# Patient Record
Sex: Male | Born: 1990 | Race: White | Hispanic: No | Marital: Single | Smoking: Never smoker
Health system: Southern US, Community
[De-identification: ages and names within clinical notes are randomized; demographics above are authoritative.]

## PROBLEM LIST (undated history)

## (undated) DIAGNOSIS — M041 Periodic fever syndromes: Secondary | ICD-10-CM

---

## 2010-01-28 ENCOUNTER — Emergency Department (HOSPITAL_COMMUNITY): Admission: EM | Admit: 2010-01-28 | Discharge: 2010-01-28 | Payer: Self-pay | Admitting: Emergency Medicine

## 2010-04-06 ENCOUNTER — Emergency Department (HOSPITAL_COMMUNITY): Admission: EM | Admit: 2010-04-06 | Discharge: 2010-04-06 | Payer: Self-pay | Admitting: Emergency Medicine

## 2010-09-27 LAB — CBC
HCT: 36.7 % — ABNORMAL LOW (ref 39.0–52.0)
Hemoglobin: 13.1 g/dL (ref 13.0–17.0)
MCH: 30.8 pg (ref 26.0–34.0)
MCV: 86.4 fL (ref 78.0–100.0)
Platelets: 176 10*3/uL (ref 150–400)
RBC: 4.25 MIL/uL (ref 4.22–5.81)
WBC: 2.7 10*3/uL — ABNORMAL LOW (ref 4.0–10.5)

## 2010-09-27 LAB — COMPREHENSIVE METABOLIC PANEL
Albumin: 3.8 g/dL (ref 3.5–5.2)
Alkaline Phosphatase: 66 U/L (ref 39–117)
BUN: 9 mg/dL (ref 6–23)
CO2: 26 mEq/L (ref 19–32)
Chloride: 102 mEq/L (ref 96–112)
Creatinine, Ser: 0.83 mg/dL (ref 0.4–1.5)
GFR calc non Af Amer: >60 mL/min (ref >60)
Glucose, Bld: 90 mg/dL (ref 70–99)
Potassium: 3.6 mEq/L (ref 3.5–5.1)
Total Bilirubin: 0.6 mg/dL (ref 0.3–1.2)

## 2010-09-27 LAB — DIFFERENTIAL
Basophils Absolute: 0 10*3/uL (ref 0.0–0.1)
Eosinophils Absolute: 0 10*3/uL (ref 0.0–0.7)
Lymphocytes Relative: 46 % (ref 12–46)
Monocytes Relative: 19 % — ABNORMAL HIGH (ref 3–12)
Neutro Abs: 0.9 10*3/uL — ABNORMAL LOW (ref 1.7–7.7)
Neutrophils Relative %: 34 % — ABNORMAL LOW (ref 43–77)

## 2010-09-29 LAB — COMPREHENSIVE METABOLIC PANEL
Albumin: 4.1 g/dL (ref 3.5–5.2)
BUN: 12 mg/dL (ref 6–23)
Calcium: 8.8 mg/dL (ref 8.4–10.5)
Creatinine, Ser: 0.85 mg/dL (ref 0.4–1.5)
Potassium: 3.8 mEq/L (ref 3.5–5.1)
Total Protein: 7.5 g/dL (ref 6.0–8.3)

## 2010-09-29 LAB — URINALYSIS, ROUTINE W REFLEX MICROSCOPIC
Glucose, UA: NEGATIVE mg/dL
Hgb urine dipstick: NEGATIVE
Ketones, ur: 15 mg/dL — AB
Leukocytes, UA: NEGATIVE
Protein, ur: 30 mg/dL — AB
Urobilinogen, UA: 2 mg/dL — ABNORMAL HIGH (ref 0.0–1.0)

## 2010-09-29 LAB — CBC
MCH: 32.3 pg (ref 26.0–34.0)
MCHC: 35 g/dL (ref 30.0–36.0)
MCV: 92.3 fL (ref 78.0–100.0)
Platelets: 151 10*3/uL (ref 150–400)
RDW: 12.1 % (ref 11.5–15.5)

## 2010-09-29 LAB — URINE MICROSCOPIC-ADD ON

## 2010-09-29 LAB — DIFFERENTIAL
Lymphocytes Relative: 20 % (ref 12–46)
Lymphs Abs: 0.6 10*3/uL — ABNORMAL LOW (ref 0.7–4.0)
Monocytes Absolute: 0.5 10*3/uL (ref 0.1–1.0)
Monocytes Relative: 18 % — ABNORMAL HIGH (ref 3–12)
Neutro Abs: 1.7 10*3/uL (ref 1.7–7.7)

## 2012-02-03 ENCOUNTER — Emergency Department (HOSPITAL_COMMUNITY)
Admission: EM | Admit: 2012-02-03 | Discharge: 2012-02-03 | Disposition: A | Discharge status: Home / Self Care | Payer: Worker's Compensation | Attending: Emergency Medicine | Admitting: Emergency Medicine

## 2012-02-03 ENCOUNTER — Encounter (HOSPITAL_COMMUNITY): Payer: Self-pay | Admitting: Emergency Medicine

## 2012-02-03 DIAGNOSIS — S61209A Unspecified open wound of unspecified finger without damage to nail, initial encounter: Secondary | ICD-10-CM | POA: Insufficient documentation

## 2012-02-03 DIAGNOSIS — Z23 Encounter for immunization: Secondary | ICD-10-CM | POA: Insufficient documentation

## 2012-02-03 DIAGNOSIS — Y93G1 Activity, food preparation and clean up: Secondary | ICD-10-CM | POA: Insufficient documentation

## 2012-02-03 DIAGNOSIS — S61019A Laceration without foreign body of unspecified thumb without damage to nail, initial encounter: Secondary | ICD-10-CM

## 2012-02-03 DIAGNOSIS — W261XXA Contact with sword or dagger, initial encounter: Secondary | ICD-10-CM | POA: Insufficient documentation

## 2012-02-03 DIAGNOSIS — W260XXA Contact with knife, initial encounter: Secondary | ICD-10-CM | POA: Insufficient documentation

## 2012-02-03 HISTORY — DX: Periodic fever syndromes: M04.1

## 2012-02-03 MED ORDER — "THROMBI-PAD 3""X3"" EX PADS"
1.0000 | MEDICATED_PAD | Freq: Once | CUTANEOUS | Status: AC
  Administered 2012-02-03: 1 via TOPICAL
  Filled 2012-02-03: qty 1

## 2012-02-03 MED ORDER — OXYCODONE-ACETAMINOPHEN 5-325 MG PO TABS
1.0000 | ORAL_TABLET | Freq: Once | ORAL | Status: AC
  Administered 2012-02-03: 1 via ORAL
  Filled 2012-02-03: qty 1

## 2012-02-03 MED ORDER — TETANUS-DIPHTH-ACELL PERTUSSIS 5-2.5-18.5 LF-MCG/0.5 IM SUSP
0.5000 mL | Freq: Once | INTRAMUSCULAR | Status: AC
  Administered 2012-02-03: 0.5 mL via INTRAMUSCULAR
  Filled 2012-02-03: qty 0.5

## 2012-02-03 NOTE — ED Provider Notes (Signed)
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  Patient has an avulsion laceration on the end of his left from that is just into the subcutaneous fat tissue it is bleeding. That nail is intact. The avulsion is a half a centimeter.  Have discussed care with his PA.  Medical screening examination/treatment/procedure(s) were conducted as a shared visit with non-physician practitioner(s) and myself.  I personally evaluated the patient during the encounter  Devoria Albe, MD, Franz Dell, MD 02/03/12 1331

## 2012-02-03 NOTE — ED Provider Notes (Signed)
See prior note   Ward Givens, MD 02/03/12 [phone]

## 2012-02-03 NOTE — ED Notes (Signed)
Dressing reinforced. Bleeding controlled.

## 2012-02-03 NOTE — ED Notes (Signed)
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

## 2012-02-03 NOTE — ED Provider Notes (Signed)
History     CSN: [id number]  Arrival date & time 02/03/12  1146   First MD Initiated Contact with Patient 02/03/12 1258      Chief Complaint  Patient presents with  . Extremity Laceration    left thumb    (Consider location/radiation/quality/duration/timing/severity/associated sxs/prior treatment) HPI Comments: 21 y/o male here s/p cutting off the tip of his left thumb at work an hour and a half ago while cutting peppers. He was unable to obtain the tip of the thumb because it got lost in the mix of the peppers which were thrown out. Last tetanus in 2006. Able to move his thumb without difficulty.  The history is provided by the patient.    Past Medical History  Diagnosis Date  . Periodic fever syndrome     No past surgical history on file.  No family history on file.  History  Substance Use Topics  . Smoking status: Never Smoker   . Smokeless tobacco: Not on file  . Alcohol Use: Yes     occasional      Review of Systems  Respiratory: Negative for shortness of breath.   Cardiovascular: Negative for chest pain.  Gastrointestinal: Negative for nausea.  Skin: Positive for wound (left thumb laceration).  Neurological: Negative for dizziness, syncope and light-headedness.    Allergies  Review of patient's allergies indicates no known allergies.  Home Medications   Current Outpatient Rx  Name Route Sig Dispense Refill  . COLCHICINE 0.6 MG PO TABS Oral Take 0.6 mg by mouth daily.    Marland Kitchen NAPROXEN 500 MG PO TABS Oral Take 500 mg by mouth 2 (two) times daily with a meal.      BP 138/88  Pulse 82  Temp 98.3 F (36.8 C)  Resp 16  SpO2 99%  Physical Exam  Constitutional: He is oriented to person, place, and time. He appears well-developed and well-nourished. No distress.  HENT:  Head: Normocephalic and atraumatic.  Eyes: Conjunctivae are normal. Pupils are equal, round, and reactive to light.  Cardiovascular: Normal rate, regular rhythm, normal heart sounds and  intact distal pulses.   Pulmonary/Chest: Effort normal and breath sounds normal.  Neurological: He is alert and oriented to person, place, and time. He has normal strength. No sensory deficit.       No evidence of neurovascular compromise.  Skin: Skin is warm. Laceration (superficial aspect of left thumb cut off. bleeding not controlled) noted.  Psychiatric: He has a normal mood and affect. His behavior is normal.    ED Course  Procedures (including critical care time)  Labs Reviewed - No data to display No results found.   No diagnosis found.    MDM  21 y/o male with left thumb superficial laceration. Thrombi-pad applied. Tetanus booster given. Patient became a little light headed while changing the wound dressing and receiving tetanus shot. Gave some water and he felt fine. Patient stable for discharge.         Trevor Mace, PA-C 02/03/12 1359

## 2013-04-06 ENCOUNTER — Emergency Department (HOSPITAL_COMMUNITY)
Admission: EM | Admit: 2013-04-06 | Discharge: 2013-04-06 | Disposition: A | Discharge status: Home / Self Care | Payer: Federal, State, Local not specified - PPO | Attending: Emergency Medicine | Admitting: Emergency Medicine

## 2013-04-06 ENCOUNTER — Encounter (HOSPITAL_COMMUNITY): Payer: Self-pay | Admitting: *Deleted

## 2013-04-06 DIAGNOSIS — Z79899 Other long term (current) drug therapy: Secondary | ICD-10-CM | POA: Insufficient documentation

## 2013-04-06 DIAGNOSIS — B86 Scabies: Secondary | ICD-10-CM | POA: Insufficient documentation

## 2013-04-06 MED ORDER — PERMETHRIN 5 % EX CREA: TOPICAL_CREAM | CUTANEOUS | Status: AC

## 2013-04-06 NOTE — ED Notes (Signed)
Pt reports developing bumps on body starting 3 weeks ago. States that his roommate was diagnosed with scabies and he thinks he has scabies, too.

## 2013-04-06 NOTE — ED Notes (Signed)
Pt reports a rash to his upper extremities, chest, back, and groin. Pt states itching started in groin area. Symptoms started three weeks ago. Pt denies trying any treatments. Pt is A&Ox4, respirations equal and unlabored, skin warm and dry.

## 2013-04-06 NOTE — ED Provider Notes (Signed)
Medical screening examination/treatment/procedure(s) were performed by non-physician practitioner and as supervising physician I was immediately available for consultation/collaboration.  Veora Fonte, MD 04/06/13 0607 

## 2013-04-06 NOTE — ED Provider Notes (Addendum)
CSN: [id number]     Arrival date & time 04/06/13  0014 History   First MD Initiated Contact with Patient 04/06/13 [phone]     Chief Complaint  Patient presents with  . Rash   (Consider location/radiation/quality/duration/timing/severity/associated sxs/prior Treatment) HPI Comments: Patient remained has known scabies.  He started having some itchy rash in the normal target areas, including groin, armpits moist and several weeks, ago.  This has progressed to generalized body lesions  Patient is a 22 y.o. male presenting with rash. The history is provided by the patient.  Rash Location:  Full body Quality: itchiness and redness   Severity:  Moderate Onset quality:  Gradual Duration: Weeks. Timing:  Constant Progression:  Worsening Chronicity:  New Context: exposure to similar rash   Relieved by:  None tried Worsened by:  Nothing tried Ineffective treatments:  None tried Associated symptoms: no fever     Past Medical History  Diagnosis Date  . Periodic fever syndrome    History reviewed. No pertinent past surgical history. History reviewed. No pertinent family history. History  Substance Use Topics  . Smoking status: Never Smoker   . Smokeless tobacco: Not on file  . Alcohol Use: Yes     Comment: occasional    Review of Systems  Constitutional: Negative for fever.  Genitourinary: Negative for scrotal swelling and testicular pain.  Skin: Positive for rash.  All other systems reviewed and are negative.    Allergies  Review of patient's allergies indicates no known allergies.  Home Medications   Current Outpatient Rx  Name  Route  Sig  Dispense  Refill  . colchicine 0.6 MG tablet   Oral   Take 0.6 mg by mouth daily.         . naproxen (NAPROSYN) 500 MG tablet   Oral   Take 500 mg by mouth 2 (two) times daily with a meal.         . permethrin (ELIMITE) 5 % cream      Apply to affected area once it on for 8-12 hours.  Wash off repeat this in 2 weeks.   60  g   1    BP 159/97  Pulse 109  Temp(Src) 98.6 F (37 C) (Oral)  Resp 16  SpO2 95% Physical Exam  Constitutional: He appears well-developed.  HENT:  Head: Normocephalic.  Eyes: Pupils are equal, round, and reactive to light.  Neck: Normal range of motion.  Cardiovascular: Normal rate.   Skin: Rash noted.    ED Course  Procedures (including critical care time) Labs Review Labs Reviewed - No data to display Imaging Review No results found.  MDM   1. Scabies     I have instructed the patient.  In the proper use of the permethrin cream to be repeated in the 14 days.  Have also recommended that he spray for his furniture car seats, a posterior items and wash all his clothing, bed linen, etc. in hot soapy water    Arman Filter, NP 04/06/13 0304  Arman Filter, NP 04/06/13 0304  Arman Filter, NP 04/06/13 0304  Arman Filter, NP 04/17/13 2000

## 2013-04-17 NOTE — ED Provider Notes (Signed)
Medical screening examination/treatment/procedure(s) were performed by non-physician practitioner and as supervising physician I was immediately available for consultation/collaboration.  Sunnie Nielsen, MD 04/17/13 [phone]

## 2015-01-11 ENCOUNTER — Emergency Department (HOSPITAL_COMMUNITY)
Admission: EM | Admit: 2015-01-11 | Discharge: 2015-01-11 | Disposition: A | Discharge status: Home / Self Care | Payer: Federal, State, Local not specified - PPO | Attending: Emergency Medicine | Admitting: Emergency Medicine

## 2015-01-11 ENCOUNTER — Encounter (HOSPITAL_COMMUNITY): Payer: Self-pay | Admitting: *Deleted

## 2015-01-11 DIAGNOSIS — Z791 Long term (current) use of non-steroidal anti-inflammatories (NSAID): Secondary | ICD-10-CM | POA: Insufficient documentation

## 2015-01-11 DIAGNOSIS — M791 Myalgia: Secondary | ICD-10-CM | POA: Insufficient documentation

## 2015-01-11 DIAGNOSIS — L02411 Cutaneous abscess of right axilla: Secondary | ICD-10-CM | POA: Insufficient documentation

## 2015-01-11 DIAGNOSIS — Z79899 Other long term (current) drug therapy: Secondary | ICD-10-CM | POA: Diagnosis not present

## 2015-01-11 DIAGNOSIS — L089 Local infection of the skin and subcutaneous tissue, unspecified: Secondary | ICD-10-CM | POA: Diagnosis present

## 2015-01-11 DIAGNOSIS — L0291 Cutaneous abscess, unspecified: Secondary | ICD-10-CM

## 2015-01-11 MED ORDER — SULFAMETHOXAZOLE-TRIMETHOPRIM 800-160 MG PO TABS
1.0000 | ORAL_TABLET | Freq: Once | ORAL | Status: AC
  Administered 2015-01-11: 1 via ORAL
  Filled 2015-01-11: qty 1

## 2015-01-11 MED ORDER — HYDROCODONE-ACETAMINOPHEN 5-325 MG PO TABS: 1.0000 | ORAL_TABLET | Freq: Four times a day (QID) | ORAL | Status: DC | PRN

## 2015-01-11 MED ORDER — SULFAMETHOXAZOLE-TRIMETHOPRIM 800-160 MG PO TABS: 1.0000 | ORAL_TABLET | Freq: Two times a day (BID) | ORAL | Status: AC

## 2015-01-11 NOTE — ED Notes (Signed)
Questions concerns denied r/t dc. Pt ambulatory and a&ox4 

## 2015-01-11 NOTE — ED Notes (Signed)
Patient came into ED today d/t boil/ pain 5/10 under right underarm. Pain started around 3 am today. He denies injury to area.

## 2015-01-11 NOTE — Discharge Instructions (Signed)

## 2015-01-11 NOTE — ED Provider Notes (Signed)
CSN: [id number]     Arrival date & time 01/11/15  1201 History  This chart was scribed for non-physician practitioner, Dahlia ClientBrowning, PA-C working with Purvis SheffieldForrest Harrison, MD by Placido SouLogan Joldersma, ED scribe. This patient was seen in room WTR6/WTR6 and the patient's care was started at 12:09 PM.    Chief Complaint  Patient presents with  . Recurrent Skin Infections    boil under right arm   The history is provided by the patient. No language interpreter was used.    HPI Comments: Noah Charles is a 24 y.o. male who presents to the Emergency Department complaining of worsening, moderate, swelling and pain beneath his right arm with onset last night. He rates the current pain as a 5/10 and notes a worsening of pain with any ambulation. Pt denies any history of abscess. He denies any known drug allergies or any other associated symptoms.   Past Medical History  Diagnosis Date  . Periodic fever syndrome    No past surgical history on file. No family history on file. History  Substance Use Topics  . Smoking status: Never Smoker   . Smokeless tobacco: Not on file  . Alcohol Use: Yes     Comment: occasional    Review of Systems  Musculoskeletal: Positive for myalgias.  Skin: Negative for color change and pallor.      Allergies  Review of patient's allergies indicates no known allergies.  Home Medications   Prior to Admission medications   Medication Sig Start Date End Date Taking? Authorizing Provider  colchicine 0.6 MG tablet Take 0.6 mg by mouth daily.    Historical Provider, MD  naproxen (NAPROSYN) 500 MG tablet Take 500 mg by mouth 2 (two) times daily with a meal.    Historical Provider, MD  permethrin (ELIMITE) 5 % cream Apply to affected area once it on for 8-12 hours.  Wash off repeat this in 2 weeks. 04/06/13   Earley FavorGail Schulz, NP   There were no vitals taken for this visit. Physical Exam  Constitutional: He is oriented to person, place, and time. He appears well-developed and  well-nourished. No distress.  HENT:  Head: Normocephalic and atraumatic.  Mouth/Throat: Oropharynx is clear and moist.  Eyes: Conjunctivae and EOM are normal. Pupils are equal, round, and reactive to light.  Neck: Normal range of motion. Neck supple. No tracheal deviation present.  Cardiovascular: Normal rate.   Pulmonary/Chest: Effort normal and breath sounds normal. No respiratory distress.  Abdominal: Soft. He exhibits no distension.  Musculoskeletal: Normal range of motion.  Neurological: He is alert and oriented to person, place, and time.  Skin: Skin is warm and dry.  Very mild area of induration in the right axillary, no surrounding erythema or cellulitis, but tender spot is approximately 0.25 cm, there is no fluctuance or discharge  Psychiatric: He has a normal mood and affect. His behavior is normal.  Nursing note and vitals reviewed.   ED Course  Procedures  DIAGNOSTIC STUDIES: Oxygen Saturation is 99% on RA, normal by my interpretation.    COORDINATION OF CARE: 12:11 PM Discussed treatment plan with pt at bedside and pt agreed to plan.  Labs Review Labs Reviewed - No data to display  Imaging Review No results found.   EKG Interpretation None      MDM   Final diagnoses:  Abscess    Patient with mild folliculitis versus developing abscess. At this time there is nothing to drain. Will recommend warm compresses and antibiotics. Patient given instructions to  return in 2 days if his symptoms worsen. Patient counseled to avoid deodorant for the time being.  I personally performed the services described in this documentation, which was scribed in my presence. The recorded information has been reviewed and is accurate.    Roxy Horseman, PA-C 01/11/15 1225  Roxy Horseman, PA-C 01/11/15 1229  Purvis Sheffield, MD 01/12/15 [phone]

## 2015-03-20 ENCOUNTER — Emergency Department (HOSPITAL_COMMUNITY): Payer: Federal, State, Local not specified - PPO

## 2015-03-20 ENCOUNTER — Encounter (HOSPITAL_COMMUNITY): Payer: Self-pay | Admitting: Emergency Medicine

## 2015-03-20 ENCOUNTER — Emergency Department (HOSPITAL_COMMUNITY)
Admission: EM | Admit: 2015-03-20 | Discharge: 2015-03-20 | Disposition: A | Discharge status: Home / Self Care | Payer: Federal, State, Local not specified - PPO | Attending: Emergency Medicine | Admitting: Emergency Medicine

## 2015-03-20 DIAGNOSIS — M778 Other enthesopathies, not elsewhere classified: Secondary | ICD-10-CM

## 2015-03-20 DIAGNOSIS — Z8639 Personal history of other endocrine, nutritional and metabolic disease: Secondary | ICD-10-CM | POA: Insufficient documentation

## 2015-03-20 DIAGNOSIS — M25522 Pain in left elbow: Secondary | ICD-10-CM | POA: Diagnosis present

## 2015-03-20 DIAGNOSIS — Z791 Long term (current) use of non-steroidal anti-inflammatories (NSAID): Secondary | ICD-10-CM | POA: Diagnosis not present

## 2015-03-20 DIAGNOSIS — Z79899 Other long term (current) drug therapy: Secondary | ICD-10-CM | POA: Insufficient documentation

## 2015-03-20 MED ORDER — TRAMADOL HCL 50 MG PO TABS: 50.0000 mg | ORAL_TABLET | Freq: Four times a day (QID) | ORAL | Status: DC | PRN

## 2015-03-20 MED ORDER — TRAMADOL HCL 50 MG PO TABS
50.0000 mg | ORAL_TABLET | Freq: Once | ORAL | Status: AC
  Administered 2015-03-20: 50 mg via ORAL
  Filled 2015-03-20: qty 1

## 2015-03-20 MED ORDER — IBUPROFEN 800 MG PO TABS
800.0000 mg | ORAL_TABLET | Freq: Once | ORAL | Status: AC
  Administered 2015-03-20: 800 mg via ORAL
  Filled 2015-03-20: qty 1

## 2015-03-20 MED ORDER — NAPROXEN 500 MG PO TABS: 500.0000 mg | ORAL_TABLET | Freq: Two times a day (BID) | ORAL | Status: AC

## 2015-03-20 NOTE — ED Notes (Signed)
Bed: WTR8 Expected date:  Expected time:  Means of arrival:  Comments: 

## 2015-03-20 NOTE — ED Notes (Signed)
Pt is a&ox4, and denied questions concerns r/t . Pt is ambulatory. Sling in place

## 2015-03-20 NOTE — ED Notes (Signed)
Pt c/o left elbow pain onset yesterday, worsened today. Denies injury. Some edema present, skin is warm and dry, no increase in temperature. Motor function and sensation intact. Radial pulses 2+.

## 2015-03-20 NOTE — Discharge Instructions (Signed)
Take naprosyn as prescribed For pain and inflammation. Take tramadol as prescribed as needed for severe pain. Please follow-up with an orthopedic specialist or primary care doctor for further evaluation of your left elbow pain. If you starthaving increased swelling, redness, warmth to the joint, or developed fever, return to emergency department.   Tendinitis Tendinitis is swelling and inflammation of the tendons. Tendons are band-like tissues that connect muscle to bone. Tendinitis commonly occurs in the:   Shoulders (rotator cuff).  Heels (Achilles tendon).  Elbows (triceps tendon). CAUSES Tendinitis is usually caused by overusing the tendon, muscles, and joints involved. When the tissue surrounding a tendon (synovium) becomes inflamed, it is called tenosynovitis. Tendinitis commonly develops in people whose jobs require repetitive motions. SYMPTOMS  Pain.  Tenderness.  Mild swelling. DIAGNOSIS Tendinitis is usually diagnosed by physical exam. Your health care provider may also order X-rays or other imaging tests. TREATMENT Your health care provider may recommend certain medicines or exercises for your treatment. HOME CARE INSTRUCTIONS   Use a sling or splint for as long as directed by your health care provider until the pain decreases.  Put ice on the injured area.  Put ice in a plastic bag.  Place a towel between your skin and the bag.  Leave the ice on for 15-20 minutes, 3-4 times a day, or as directed by your health care provider.  Avoid using the limb while the tendon is painful. Perform gentle range of motion exercises only as directed by your health care provider. Stop exercises if pain or discomfort increase, unless directed otherwise by your health care provider.  Only take over-the-counter or prescription medicines for pain, discomfort, or fever as directed by your health care provider. SEEK MEDICAL CARE IF:   Your pain and swelling increase.  You develop new,  unexplained symptoms, especially increased numbness in the hands. MAKE SURE YOU:   Understand these instructions.  Will watch your condition.  Will get help right away if you are not doing well or get worse. Document Released: 06/28/2000 Document Revised: 11/15/2013 Document Reviewed: 09/17/2010 [facility] Patient Information 2015 [place], [place]. This information is not intended to replace advice given to you by your health care provider. Make sure you discuss any questions you have with your health care provider.

## 2015-03-20 NOTE — ED Provider Notes (Signed)
CSN: [id number]     Arrival date & time 03/20/15  1016 History   First MD Initiated Contact with Patient 03/20/15 1023     Chief Complaint  Patient presents with  . Elbow Pain     (Consider location/radiation/quality/duration/timing/severity/associated sxs/prior Treatment) HPI Noah Charles is a 24 y.o. male with no medical problems, presents to ED with complaint of left elbow pain. Pt states he woke up with pain yesterday morning. He denies any injuries. He denies any numbness or weakness to the hand. 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 He reports pain with movement of the elbow and when using hand. Denies fever, chills. No IV drug use. No hx of the same. Took ibuprofen yesterday and tramadol today with no pain relief.   Past Medical History  Diagnosis Date  . Periodic fever syndrome    History reviewed. No pertinent past surgical history. History reviewed. No pertinent family history. Social History  Substance Use Topics  . Smoking status: Never Smoker   . Smokeless tobacco: None  . Alcohol Use: Yes     Comment: occasional    Review of Systems  Constitutional: Negative for fever and chills.  Respiratory: Negative for cough, chest tightness and shortness of breath.   Cardiovascular: Negative for chest pain, palpitations and leg swelling.  Musculoskeletal: Positive for joint swelling and arthralgias. Negative for myalgias, neck pain and neck stiffness.  Skin: Negative for rash.  Allergic/Immunologic: Negative for immunocompromised state.  Neurological: Negative for dizziness, weakness, light-headedness, numbness and headaches.  All other systems reviewed and are negative.     Allergies  Review of patient's allergies indicates no known allergies.  Home Medications   Prior to Admission medications   Medication Sig Start Date End Date Taking? Authorizing Provider  colchicine 0.6 MG tablet Take 0.6 mg by mouth daily.    Historical Provider, MD   HYDROcodone-acetaminophen (NORCO/VICODIN) 5-325 MG per tablet Take 1 tablet by mouth every 6 (six) hours as needed. 01/11/15   Roxy Horseman, PA-C  naproxen (NAPROSYN) 500 MG tablet Take 500 mg by mouth 2 (two) times daily with a meal.    Historical Provider, MD  permethrin (ELIMITE) 5 % cream Apply to affected area once it on for 8-12 hours.  Wash off repeat this in 2 weeks. 04/06/13   Earley Favor, NP   BP 159/111 mmHg  Pulse 106  Temp(Src) 97.6 F (36.4 C) (Oral)  Resp 20  SpO2 99% Physical Exam  Constitutional: He is oriented to person, place, and time. He appears well-developed and well-nourished. No distress.  HENT:  Head: Normocephalic and atraumatic.  Eyes: Conjunctivae are normal.  Neck: Neck supple.  Cardiovascular: Normal rate, regular rhythm and normal heart sounds.   Pulmonary/Chest: Effort normal. No respiratory distress. He has no wheezes. He has no rales.  Musculoskeletal: He exhibits no edema.  Mild edema to the left elbow noted. Tenderness to palpation over medial epicondyle of the elbow. No tenderness over lateral condyle, no tenderness over olecranon. There is no erythema or warmth to the joint.ain with flexion and extension. Patient unable to extend his elbow all the way. Pain with wrist flexion and supination and pronation.  Neurological: He is alert and oriented to person, place, and time.  Skin: Skin is warm and dry.  Nursing note and vitals reviewed.   ED Course  Procedures (including critical care time) Labs Review Labs Reviewed - No data to display  Imaging Review Dg Elbow Complete Left  03/20/2015   CLINICAL DATA:  Acute left elbow pain for 2 days.  No known injury.  EXAM: LEFT ELBOW - COMPLETE 3+ VIEW  COMPARISON:  None.  FINDINGS: There is no evidence of fracture, subluxation or dislocation.  There may be a small joint effusion present.  No other bony or joint abnormalities are identified.  IMPRESSION: Question small joint effusion.  No other significant  abnormalities.   Electronically Signed   By: Harmon Pier M.D.   On: 03/20/2015 12:00   I have personally reviewed and evaluated these images and lab results as part of my medical decision-making.   EKG Interpretation None      MDM   Final diagnoses:  Left elbow tendonitis   Patient with nontraumatic left elbow pain. There is some elbow swelling noted. He has no tenderness over the joint except for over the medial aspect over medial olecranon. X-ray showing possible small joint effusion. I am suspicious of tendinitis as the cause of patient's pain, however I did discuss with him possibility of joint infection Versus gout. He states he has taken colchicine in the past, however denies any prior diagnosis of gout. I also do not think he has a joint infection at this time, since he does have full range of motion of the joint and there is no tenderness abdomen over medial aspect. He is instructed to Return to emergency department if he develop any redness, warmth to the joint, fever, any new concerning symptoms. Otherwise follow up with orthopedic specialist. I have given him an Ace wrap for swelling and a sling.  Filed Vitals:   03/20/15 1021 03/20/15 1022  BP: 159/111 159/111  Pulse: 98 106  Temp: 97.6 F (36.4 C)   TempSrc: Oral   Resp: 20   SpO2: 99% 99%     Jaynie Crumble, PA-C 03/20/15 1233  Mancel Bale, MD 03/20/15 [phone]

## 2016-10-22 IMAGING — DX DG ELBOW COMPLETE 3+V*L*
4 series · 4 of 4 positions shown · non-contrast
Comparison: None.

CLINICAL DATA: Acute left elbow pain for 2 days.  No known injury.

EXAM:
LEFT ELBOW - COMPLETE 3+ VIEW

[elbow ap]
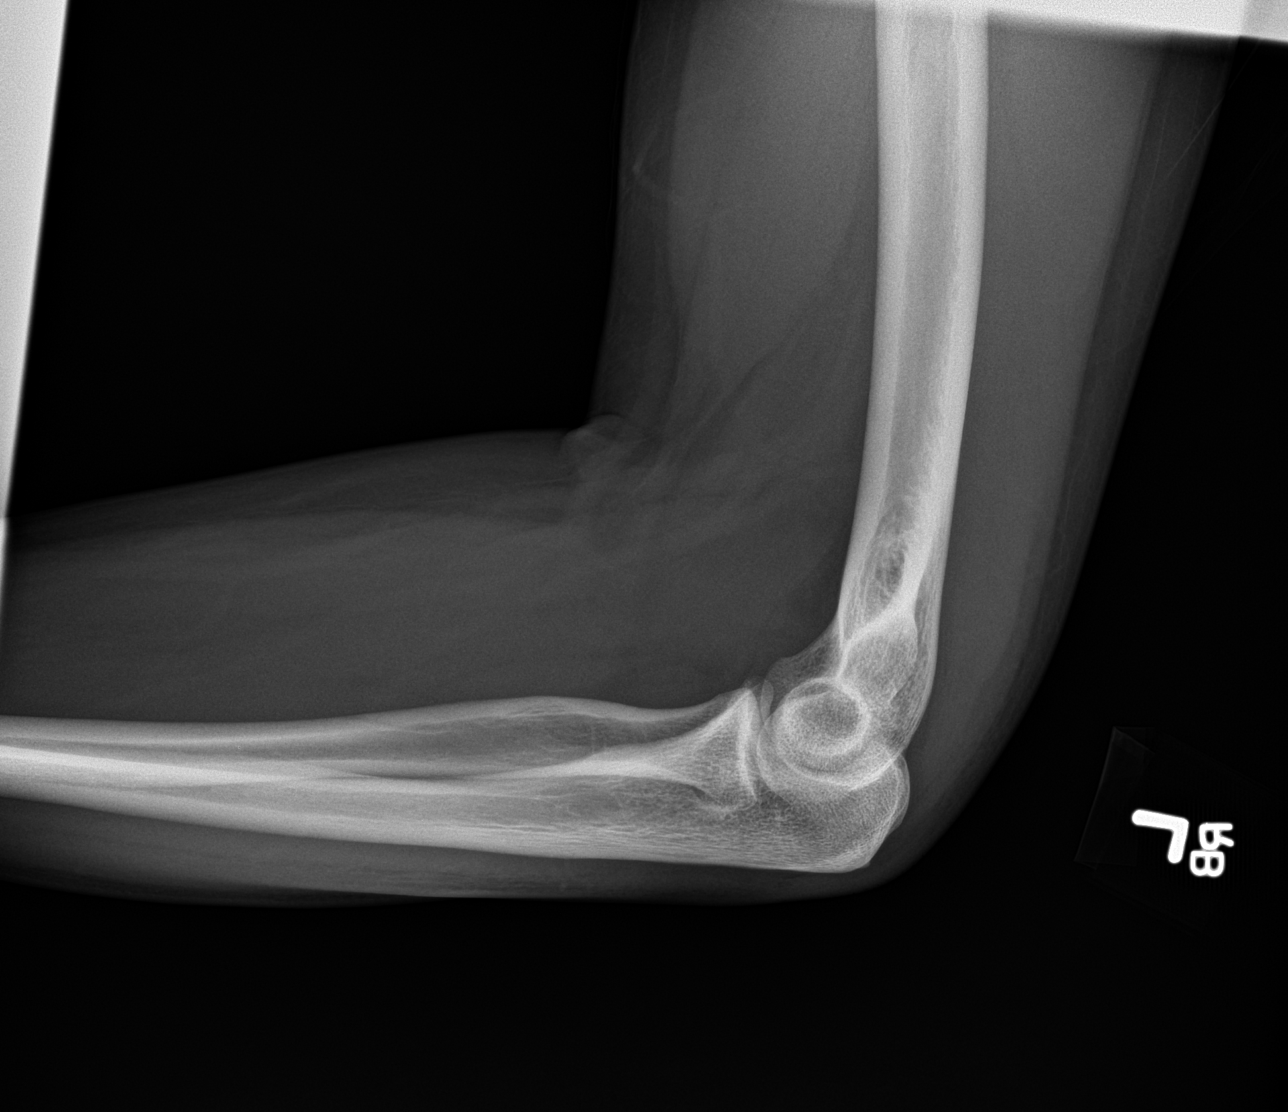

[elbow lat]
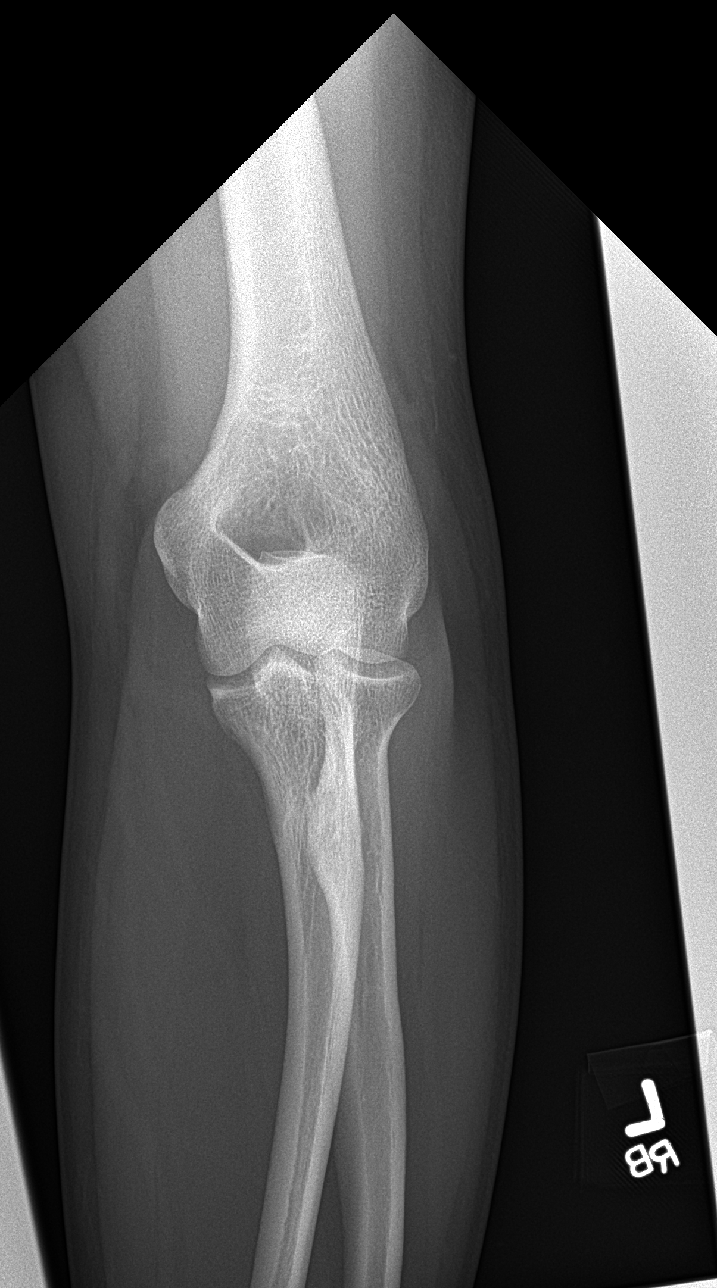

[elbow obl]
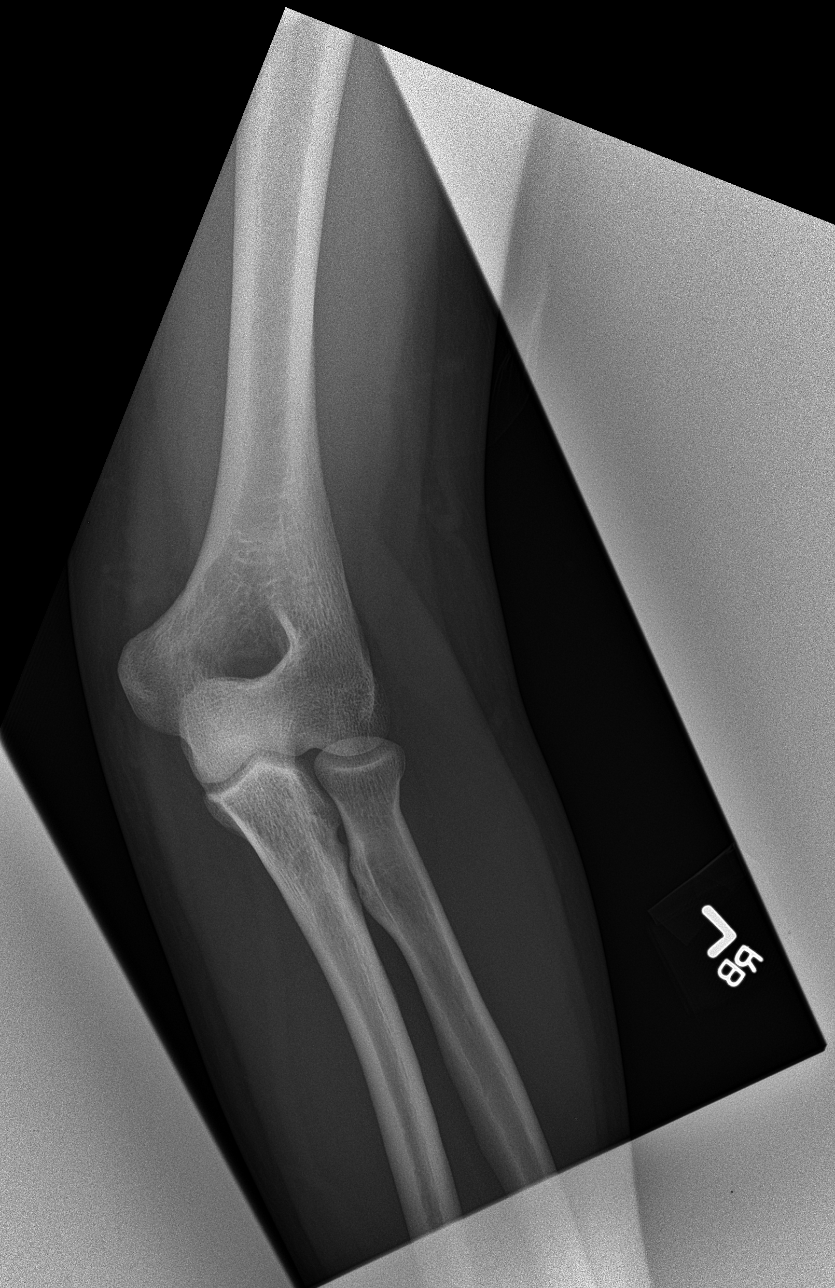

[elbow axial]
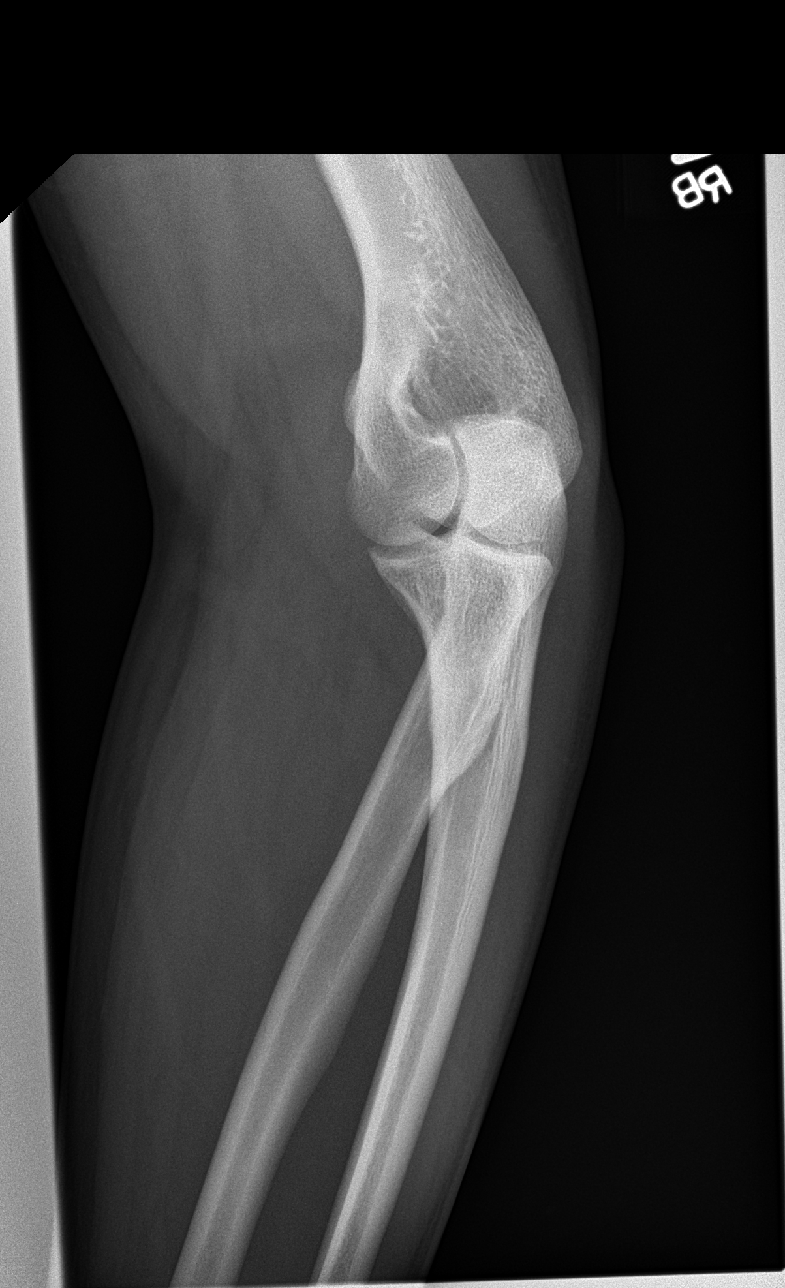

[4 of 4 positions shown; findings below may reference images not displayed]

FINDINGS: There is no evidence of fracture, subluxation or dislocation.

There may be a small joint effusion present.

No other bony or joint abnormalities are identified.
IMPRESSION: Question small joint effusion.  No other significant abnormalities.

## 2018-09-14 ENCOUNTER — Ambulatory Visit (HOSPITAL_COMMUNITY)
Admission: EM | Admit: 2018-09-14 | Discharge: 2018-09-14 | Disposition: A | Discharge status: Home / Self Care | Payer: Federal, State, Local not specified - PPO | Attending: Family Medicine | Admitting: Family Medicine

## 2018-09-14 ENCOUNTER — Encounter (HOSPITAL_COMMUNITY): Payer: Self-pay | Admitting: Emergency Medicine

## 2018-09-14 DIAGNOSIS — M5441 Lumbago with sciatica, right side: Secondary | ICD-10-CM

## 2018-09-14 MED ORDER — CYCLOBENZAPRINE HCL 10 MG PO TABS: 10.0000 mg | ORAL_TABLET | Freq: Two times a day (BID) | ORAL | 0 refills | Status: AC | PRN

## 2018-09-14 MED ORDER — PREDNISONE 10 MG (21) PO TBPK: ORAL_TABLET | ORAL | 0 refills | Status: AC

## 2018-09-14 NOTE — ED Notes (Signed)
Patient changing into gown 

## 2018-09-14 NOTE — ED Triage Notes (Signed)
Pt here after hitting back on top of slide 5 days ago; pt sts pain radiating to right leg and bruising noted

## 2018-09-14 NOTE — Discharge Instructions (Addendum)
We will treat the nerve inflammation in your  back with prednisone taper. Do not take any other NSAID's while taking this medication.  Muscle relaxer to take as needed for muscle spasm.  Return for continued symptoms If you start to develop any weakness in the legs, numbness or tingling in the groin area or problems with bowel and bladder please go to the ER.

## 2018-09-16 NOTE — ED Provider Notes (Signed)
[facility]    CSN: [id number] Arrival date & time: 09/14/18  1227     History   Chief Complaint Chief Complaint  Patient presents with  . Fall    HPI Konner Saiz is a 28 y.o. male.   Patient is a 28 year old male who presents today with back pain.  This started after hitting his back on top of a slide 5 days ago.  He does have bruising to the right upper back area.  Most of his pain is in the lower lumbar spine and musculature.  The pain is there all the time.  He reports he has had some improvement in pain over the past 5 days.  He has had most of his pain on the right side with some numbness, tingling and radiation of pain into the buttocks.  Denies any weakness, fevers, saddle paresthesias, loss of bowel or bladder function.  He has been taking ibuprofen for his symptoms with minimal relief.  Denies any hematuria.  ROS per HPI    Fall     Past Medical History:  Diagnosis Date  . Periodic fever syndrome (HCC)     There are no active problems to display for this patient.   History reviewed. No pertinent surgical history.     Home Medications    Prior to Admission medications   Medication Sig Start Date End Date Taking? Authorizing Provider  colchicine 0.6 MG tablet Take 0.6 mg by mouth daily.    [provider]  cyclobenzaprine (FLEXERIL) 10 MG tablet Take 1 tablet (10 mg total) by mouth 2 (two) times daily as needed for muscle spasms. 09/14/18   Dahlia Byes A, NP  naproxen (NAPROSYN) 500 MG tablet Take 500 mg by mouth 2 (two) times daily with a meal.    [provider]  naproxen (NAPROSYN) 500 MG tablet Take 1 tablet (500 mg total) by mouth 2 (two) times daily. 03/20/15   Kirichenko, Tatyana, PA-C  permethrin (ELIMITE) 5 % cream Apply to affected area once it on for 8-12 hours.  Wash off repeat this in 2 weeks. 04/06/13   Earley Favor, NP  predniSONE (STERAPRED UNI-PAK 21 TAB) 10 MG (21) TBPK tablet 6 tabs for 1 day, then 5 tabs for 1  das, then 4 tabs for 1 day, then 3 tabs for 1 day, 2 tabs for 1 day, then 1 tab for 1 day 09/14/18   Janace Aris, NP    Family History Family History  Family history unknown: Yes    Social History Social History   Tobacco Use  . Smoking status: Never Smoker  Substance Use Topics  . Alcohol use: Yes    Comment: occasional  . Drug use: Not on file     Allergies   Patient has no known allergies.   Review of Systems Review of Systems   Physical Exam Triage Vital Signs ED Triage Vitals [09/14/18 1306]  Enc Vitals Group     BP (!) 161/101     Pulse Rate 86     Resp 18     Temp 98.3 F (36.8 C)     Temp Source Temporal     SpO2 100 %     Weight      Height      Head Circumference      Peak Flow      Pain Score 10     Pain Loc      Pain Edu?      Excl.  in GC?    No data found.  Updated Vital Signs BP (!) 161/101 (BP Location: Right Arm)   Pulse 86   Temp 98.3 F (36.8 C) (Temporal)   Resp 18   SpO2 100%   Visual Acuity Right Eye Distance:   Left Eye Distance:   Bilateral Distance:    Right Eye Near:   Left Eye Near:    Bilateral Near:     Physical Exam Vitals signs and nursing note reviewed.  Constitutional:      Appearance: Normal appearance.     Comments: Appears in pain, holding back   HENT:     Head: Normocephalic and atraumatic.     Nose: Nose normal.  Eyes:     Conjunctiva/sclera: Conjunctivae normal.  Neck:     Musculoskeletal: Normal range of motion.  Pulmonary:     Effort: Pulmonary effort is normal.  Abdominal:     Tenderness: There is no right CVA tenderness or left CVA tenderness.  Musculoskeletal: Normal range of motion.     Lumbar back: He exhibits tenderness, swelling, edema and pain. He exhibits no deformity.       Arms:     Comments: Old bruising to the right upper back area that is mildly tender.  Tender to the lower lumbar spine and paravertebral muscles with muscle spasm.  Positive SLR  Skin:    General: Skin is  warm and dry.  Neurological:     Mental Status: He is alert.  Psychiatric:        Mood and Affect: Mood normal.      UC Treatments / Results  Labs (all labs ordered are listed, but only abnormal results are displayed) Labs Reviewed - No data to display  EKG None  Radiology No results found.  Procedures Procedures (including critical care time)  Medications Ordered in UC Medications - No data to display  Initial Impression / Assessment and Plan / UC Course  I have reviewed the triage vital signs and the nursing notes.  Pertinent labs & imaging results that were available during my care of the patient were reviewed by me and considered in my medical decision making (see chart for details).    Radiculopathy and muscle spasm.   Pt is a 28 year old male with lower lumbar pain and positive SLR.  I would like to do a lumbar spine x ray based on the amount of pain he is in but he is refusing due to cost.  I feel it is appropriate to treat pain and monitor. He is not having any concerning signs or symptoms. His pain has somewhat improved over the 5 days.   Treating with prednisone taper and flexeril for muscle relaxant.  Strict precaution that if he does not get better or starts to develop any worsening problems then he needs to return for xray.  Pt agreed.   Final Clinical Impressions(s) / UC Diagnoses   Final diagnoses:  Acute right-sided low back pain with right-sided sciatica     Discharge Instructions     We will treat the nerve inflammation in your  back with prednisone taper. Do not take any other NSAID's while taking this medication.  Muscle relaxer to take as needed for muscle spasm.  Return for continued symptoms If you start to develop any weakness in the legs, numbness or tingling in the groin area or problems with bowel and bladder please go to the ER.      ED Prescriptions  Medication Sig Dispense Auth. Provider   predniSONE (STERAPRED UNI-PAK 21  TAB) 10 MG (21) TBPK tablet 6 tabs for 1 day, then 5 tabs for 1 das, then 4 tabs for 1 day, then 3 tabs for 1 day, 2 tabs for 1 day, then 1 tab for 1 day 21 tablet Braeleigh Pyper A, NP   cyclobenzaprine (FLEXERIL) 10 MG tablet Take 1 tablet (10 mg total) by mouth 2 (two) times daily as needed for muscle spasms. 20 tablet Dahlia Byes A, NP     Controlled Substance Prescriptions [place] Controlled Substance Registry consulted? Not Applicable   Janace Aris, NP 09/16/18 [phone]
